# Patient Record
Sex: Male | Born: 1965 | Race: Black or African American | Hispanic: No | Marital: Married
Health system: Southern US, Community
[De-identification: ages and names within clinical notes are randomized; demographics above are authoritative.]

---

## 2013-08-24 ENCOUNTER — Other Ambulatory Visit: Payer: Self-pay | Admitting: Family Medicine

## 2013-08-24 ENCOUNTER — Ambulatory Visit
Admission: RE | Admit: 2013-08-24 | Discharge: 2013-08-24 | Disposition: A | Discharge status: Home / Self Care | Payer: BC Managed Care – PPO | Source: Ambulatory Visit | Attending: Family Medicine | Admitting: Family Medicine

## 2013-08-24 DIAGNOSIS — M545 Low back pain: Secondary | ICD-10-CM

## 2013-08-24 DIAGNOSIS — R079 Chest pain, unspecified: Secondary | ICD-10-CM

## 2013-09-08 ENCOUNTER — Other Ambulatory Visit: Payer: Self-pay | Admitting: Gastroenterology

## 2013-09-08 DIAGNOSIS — R1310 Dysphagia, unspecified: Secondary | ICD-10-CM

## 2013-09-08 DIAGNOSIS — R142 Eructation: Secondary | ICD-10-CM

## 2013-09-08 DIAGNOSIS — R143 Flatulence: Secondary | ICD-10-CM

## 2013-09-08 DIAGNOSIS — R141 Gas pain: Secondary | ICD-10-CM

## 2013-09-09 ENCOUNTER — Other Ambulatory Visit: Payer: BC Managed Care – PPO

## 2013-09-12 ENCOUNTER — Other Ambulatory Visit: Payer: BC Managed Care – PPO

## 2013-09-13 ENCOUNTER — Other Ambulatory Visit: Payer: BC Managed Care – PPO

## 2013-09-20 ENCOUNTER — Ambulatory Visit
Admission: RE | Admit: 2013-09-20 | Discharge: 2013-09-20 | Disposition: A | Discharge status: Home / Self Care | Payer: BC Managed Care – PPO | Source: Ambulatory Visit | Attending: Gastroenterology | Admitting: Gastroenterology

## 2013-09-20 DIAGNOSIS — R142 Eructation: Secondary | ICD-10-CM

## 2013-09-20 DIAGNOSIS — R1310 Dysphagia, unspecified: Secondary | ICD-10-CM

## 2013-09-20 DIAGNOSIS — R143 Flatulence: Secondary | ICD-10-CM

## 2013-09-20 DIAGNOSIS — R141 Gas pain: Secondary | ICD-10-CM

## 2016-01-09 IMAGING — CR DG CHEST 2V
2 series · 2 of 2 positions shown · non-contrast
Comparison: None.

CLINICAL DATA: Left chest pain for 1 week.

EXAM:
CHEST  2 VIEW

[view not recorded (1 of 2)]
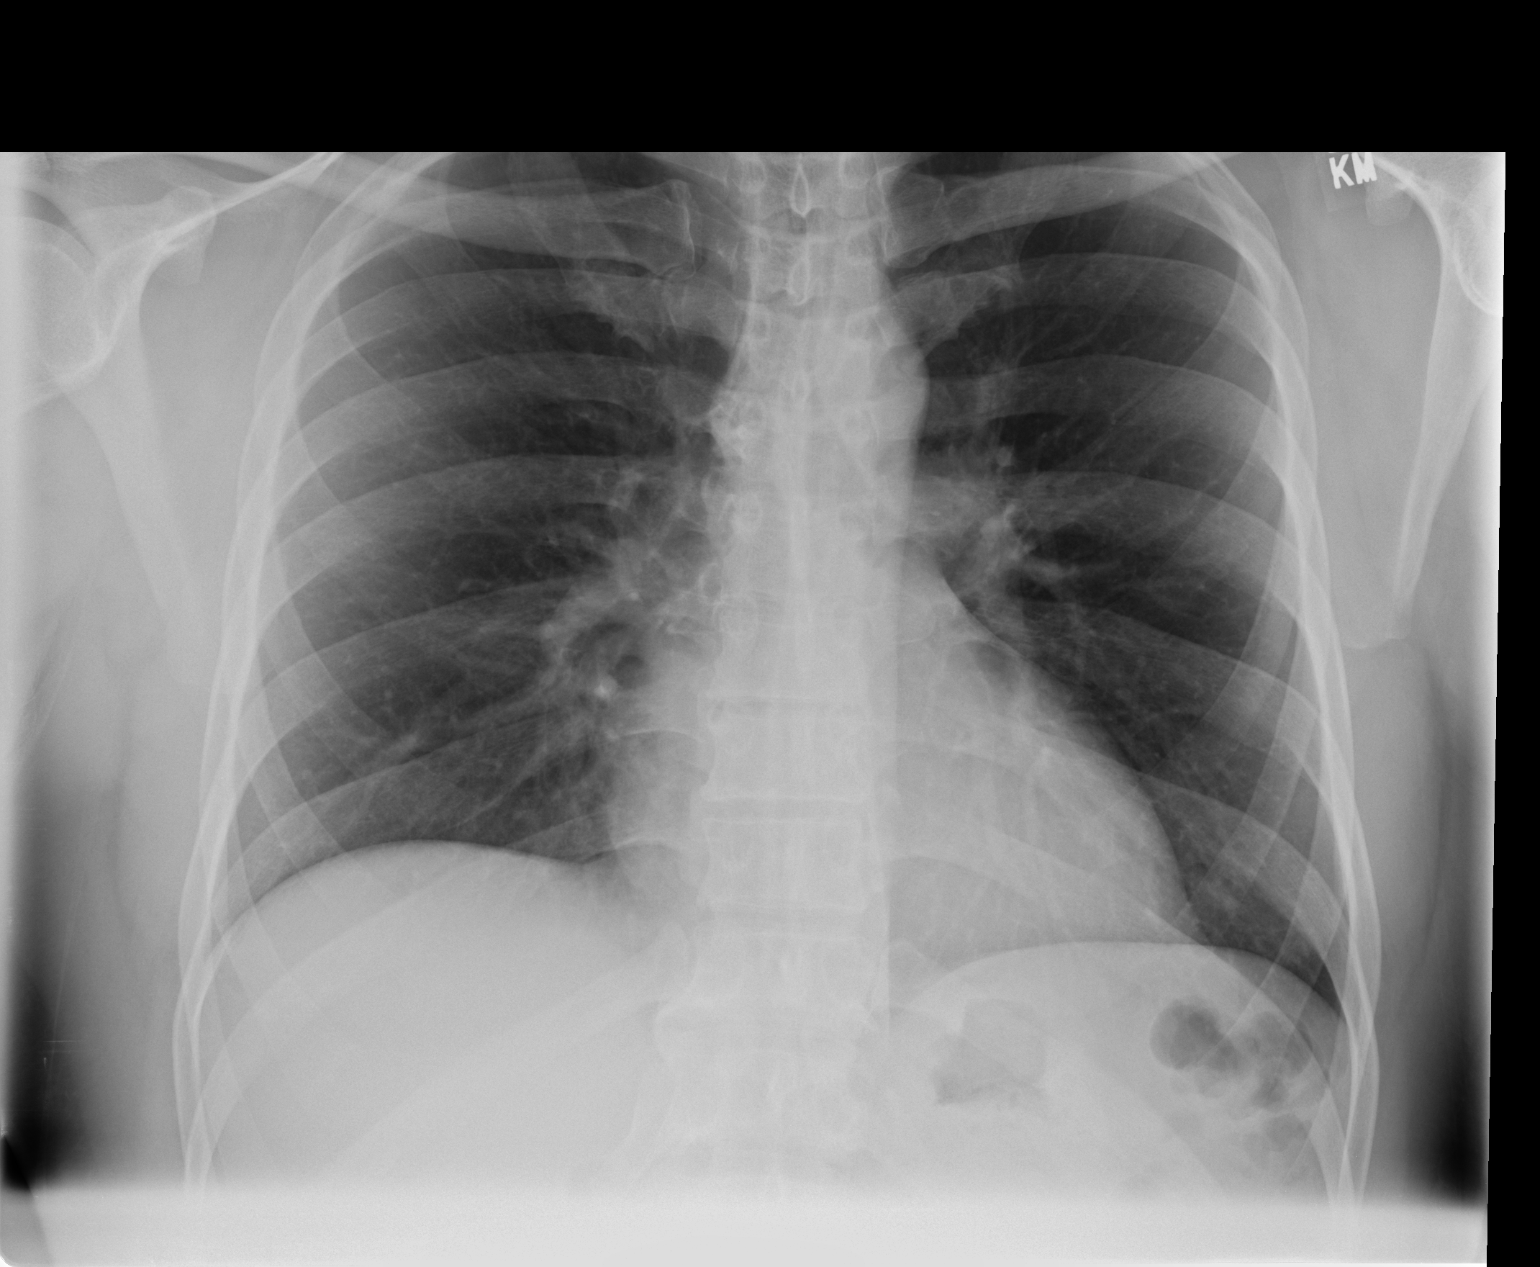

[view not recorded (2 of 2)]
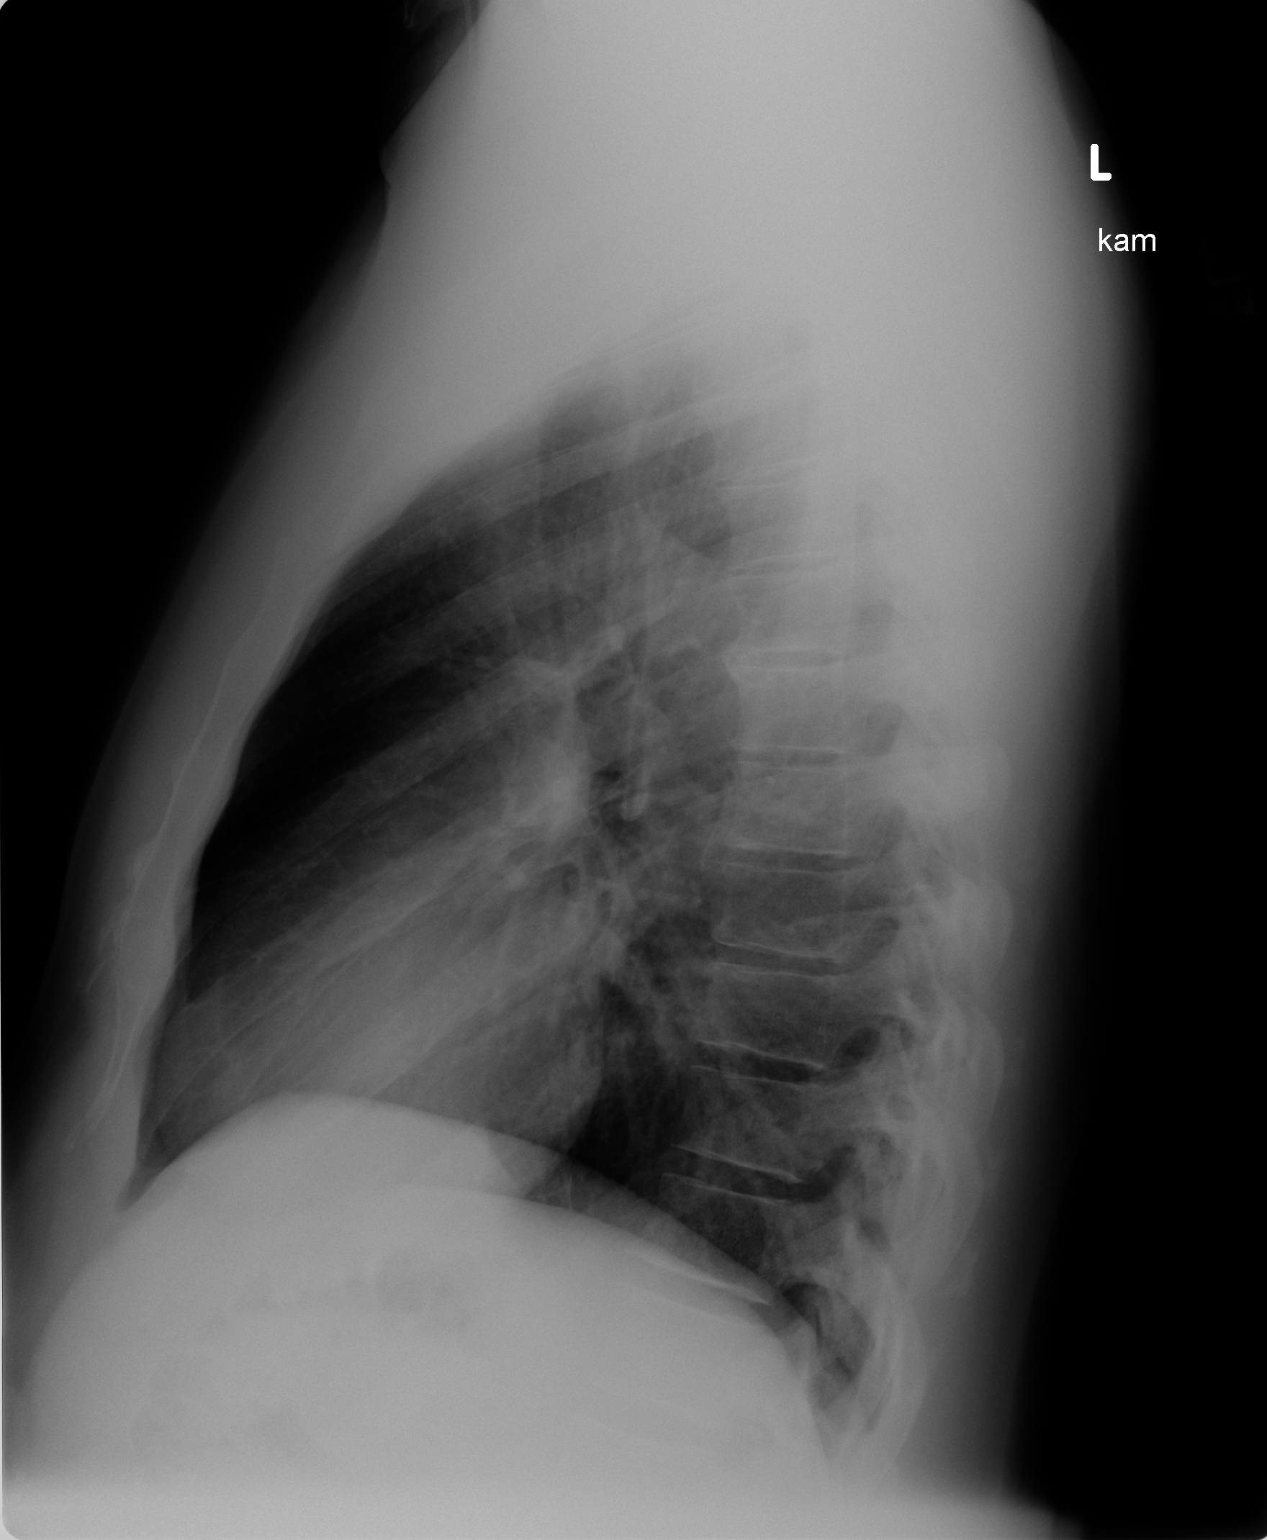

[2 of 2 positions shown; findings below may reference images not displayed]

FINDINGS: The heart size and mediastinal contours are within normal limits.
Both lungs are clear. The visualized skeletal structures are
unremarkable.
IMPRESSION: No active cardiopulmonary disease.

## 2016-01-09 IMAGING — CR DG LUMBAR SPINE 2-3V
3 series · 3 of 3 positions shown · non-contrast
Comparison: None.

CLINICAL DATA: Pain

EXAM:
LUMBAR SPINE - 2-3 VIEW

[view not recorded (1 of 3)]
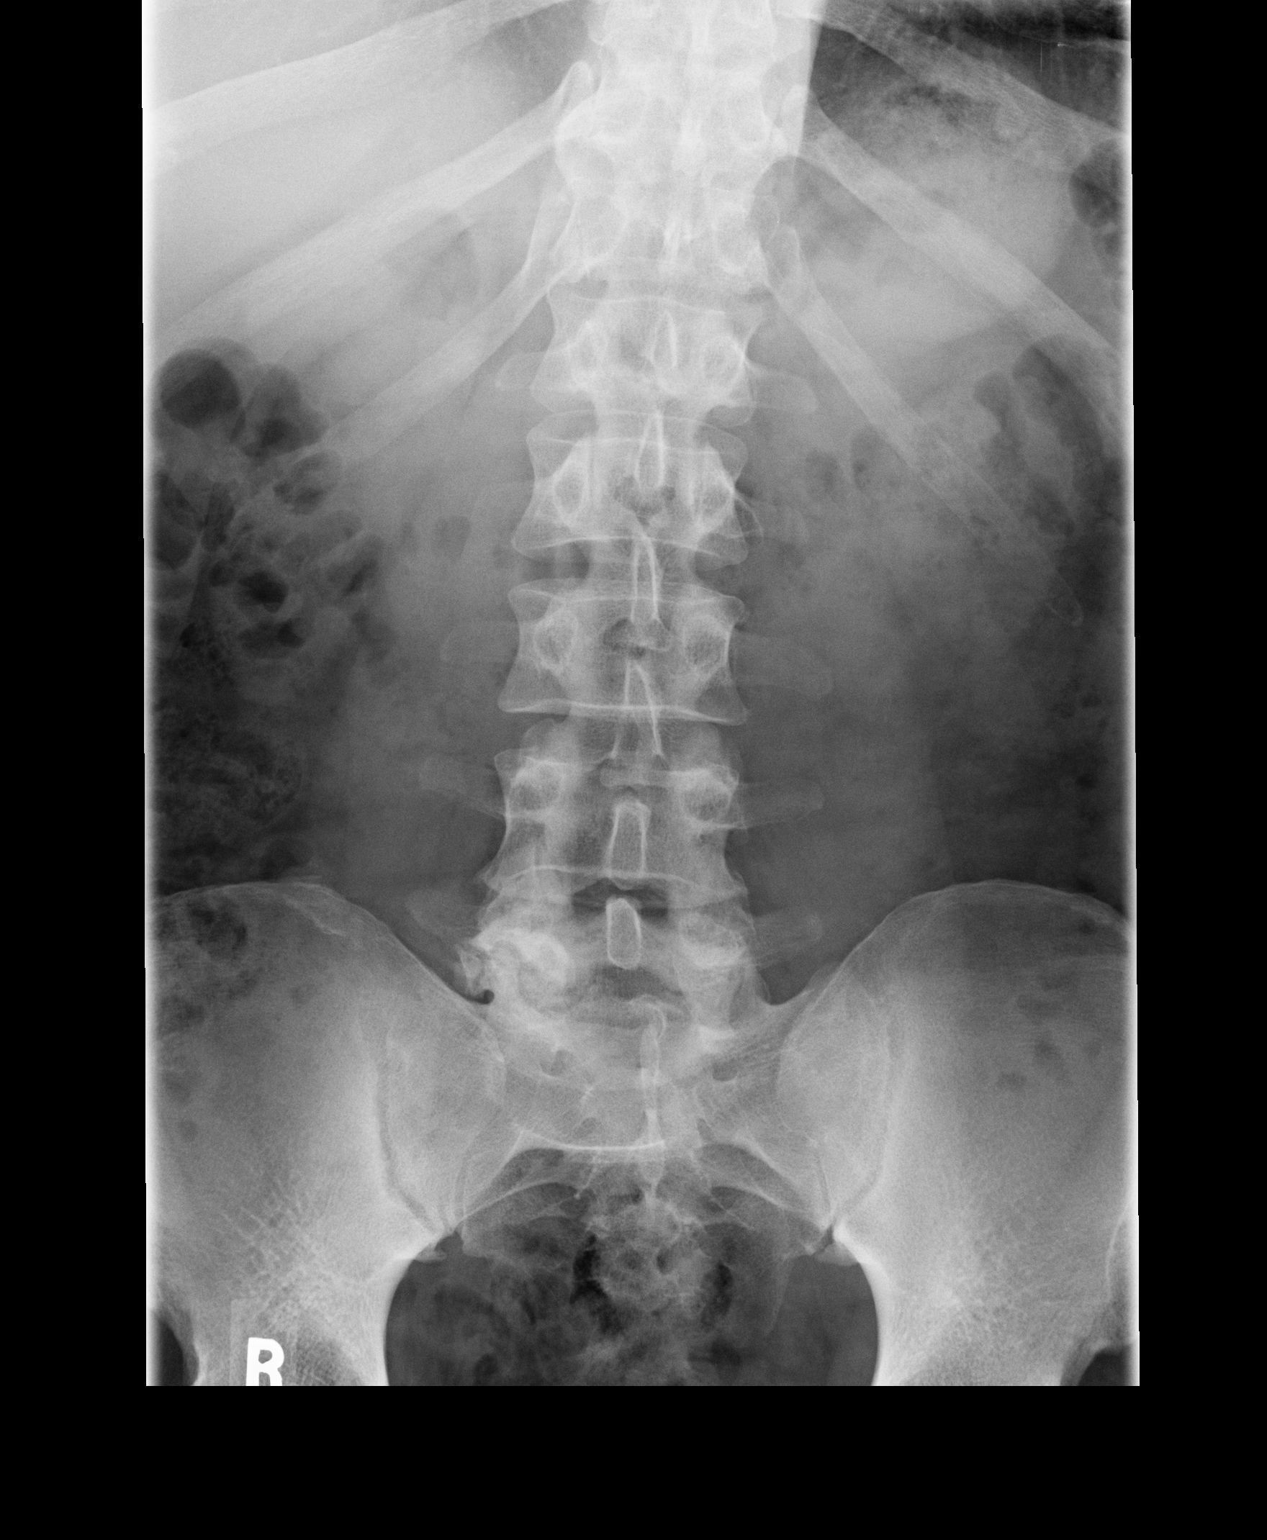

[view not recorded (2 of 3)]
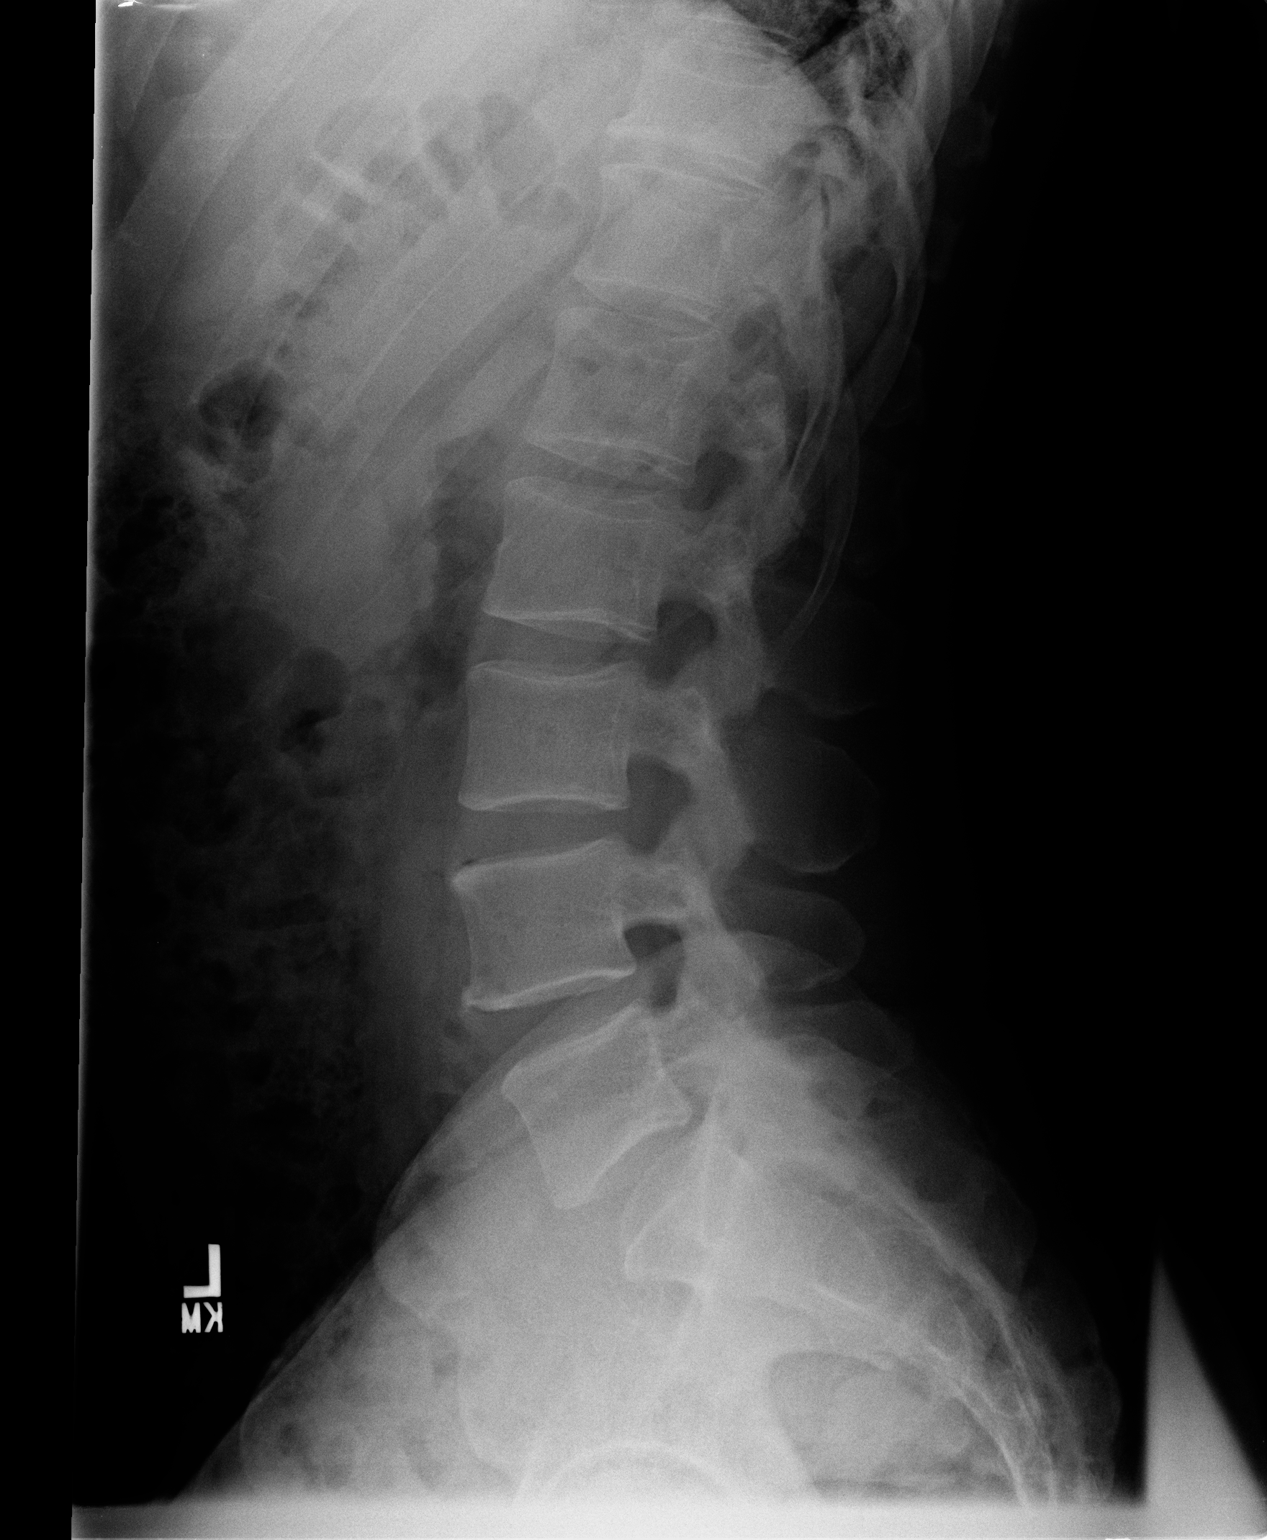

[view not recorded (3 of 3)]
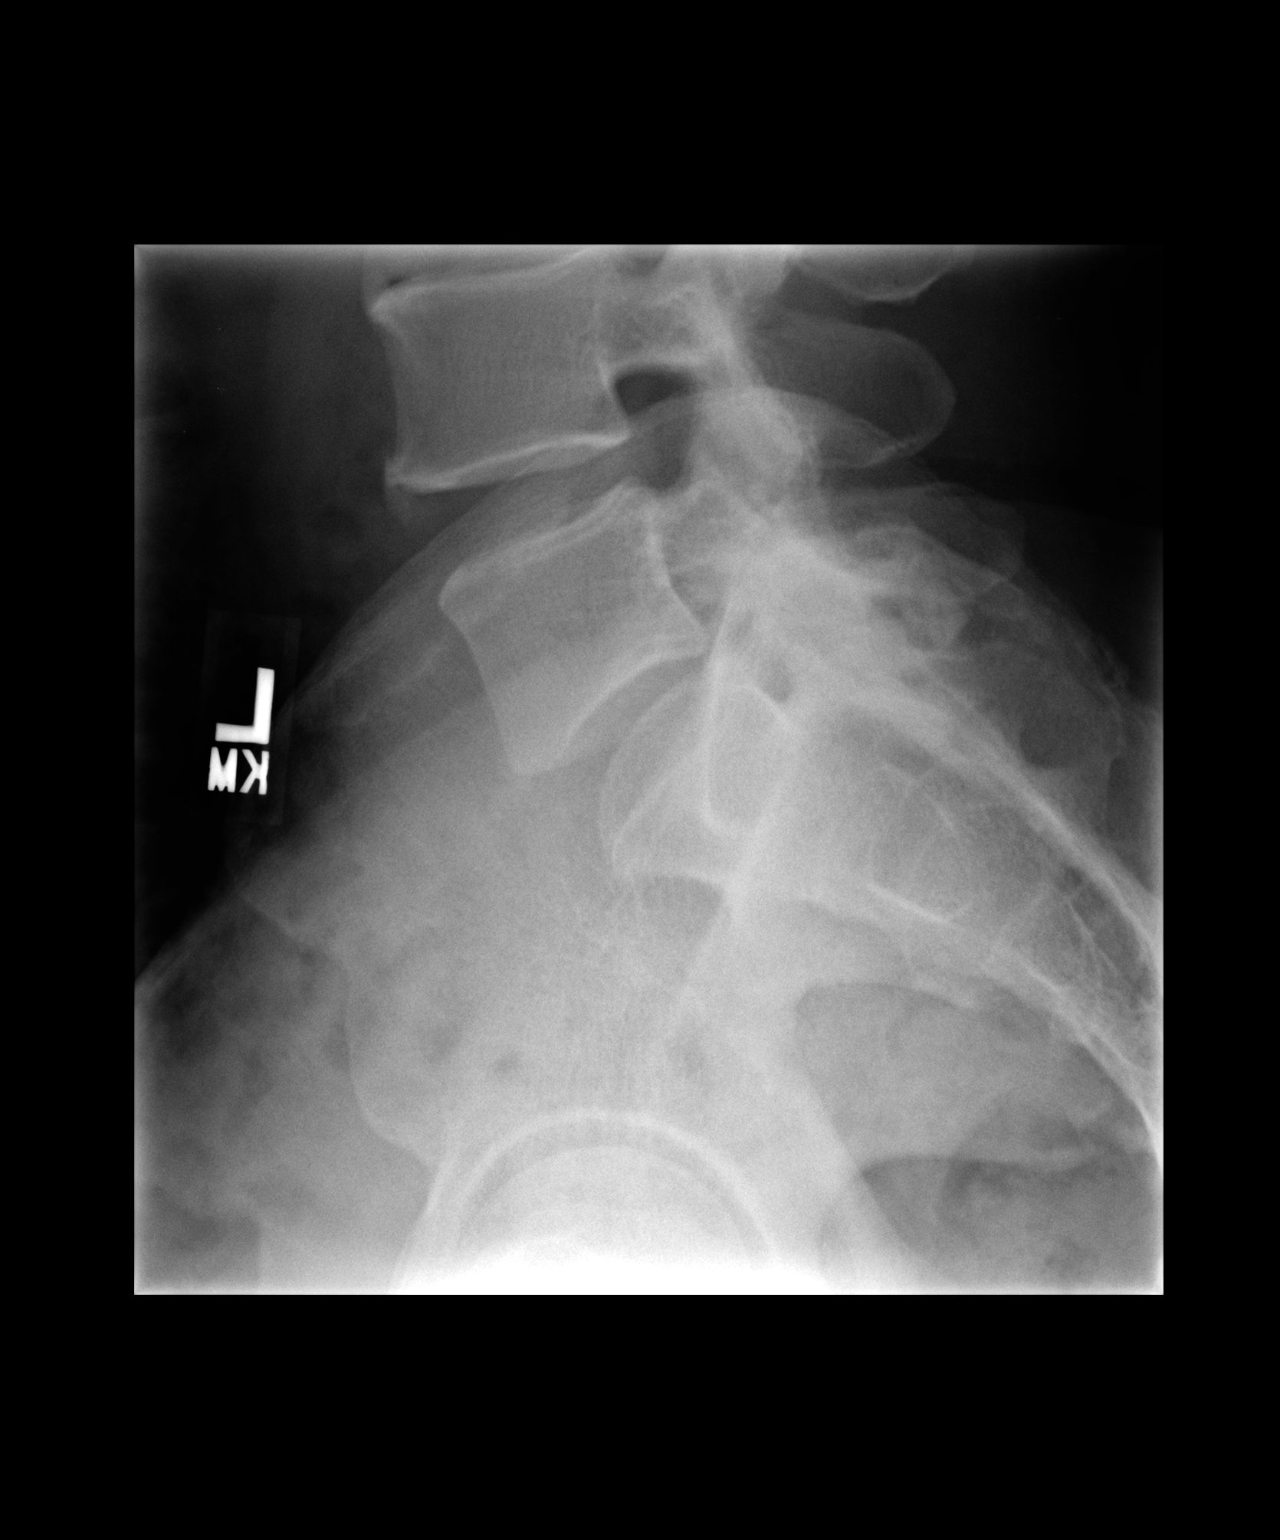

[3 of 3 positions shown; findings below may reference images not displayed]

FINDINGS: Frontal, lateral, and spot lumbosacral lateral images were obtained.
There are 5 non-rib-bearing lumbar type vertebral bodies. There is
mild thoracolumbar dextroscoliosis. There is no fracture or
spondylolisthesis. Disc spaces appear intact. No erosive change.
IMPRESSION: Mild scoliosis.  No fracture or appreciable disc space narrowing.

## 2019-04-22 ENCOUNTER — Other Ambulatory Visit: Payer: Self-pay | Admitting: Family Medicine

## 2019-04-22 DIAGNOSIS — E7800 Pure hypercholesterolemia, unspecified: Secondary | ICD-10-CM
